# Patient Record
Sex: Female | Born: 1995 | Race: White | Hispanic: No | Marital: Single | Smoking: Current every day smoker
Health system: Southern US, Community
[De-identification: ages and names within clinical notes are randomized; demographics above are authoritative.]

## PROBLEM LIST (undated history)

## (undated) DIAGNOSIS — R011 Cardiac murmur, unspecified: Secondary | ICD-10-CM

---
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
## 2019-10-20 ENCOUNTER — Emergency Department: Payer: Medicaid Other

## 2019-10-20 ENCOUNTER — Emergency Department
Admission: EM | Admit: 2019-10-20 | Discharge: 2019-10-20 | Disposition: A | Discharge status: Home / Self Care | Payer: Medicaid Other | Attending: Emergency Medicine | Admitting: Emergency Medicine

## 2019-10-20 ENCOUNTER — Other Ambulatory Visit: Payer: Self-pay

## 2019-10-20 ENCOUNTER — Encounter: Payer: Self-pay | Admitting: Emergency Medicine

## 2019-10-20 DIAGNOSIS — Y9389 Activity, other specified: Secondary | ICD-10-CM | POA: Diagnosis not present

## 2019-10-20 DIAGNOSIS — W25XXXA Contact with sharp glass, initial encounter: Secondary | ICD-10-CM | POA: Diagnosis not present

## 2019-10-20 DIAGNOSIS — S90851A Superficial foreign body, right foot, initial encounter: Secondary | ICD-10-CM | POA: Insufficient documentation

## 2019-10-20 DIAGNOSIS — Y92018 Other place in single-family (private) house as the place of occurrence of the external cause: Secondary | ICD-10-CM | POA: Diagnosis not present

## 2019-10-20 DIAGNOSIS — Y998 Other external cause status: Secondary | ICD-10-CM | POA: Insufficient documentation

## 2019-10-20 DIAGNOSIS — Z79899 Other long term (current) drug therapy: Secondary | ICD-10-CM | POA: Insufficient documentation

## 2019-10-20 DIAGNOSIS — F17210 Nicotine dependence, cigarettes, uncomplicated: Secondary | ICD-10-CM | POA: Diagnosis not present

## 2019-10-20 DIAGNOSIS — S99921A Unspecified injury of right foot, initial encounter: Secondary | ICD-10-CM | POA: Diagnosis present

## 2019-10-20 MED ORDER — LIDOCAINE HCL (PF) 1 % IJ SOLN
INTRAMUSCULAR | Status: AC
  Administered 2019-10-20: 10:00:00 5 mL
  Filled 2019-10-20: qty 5

## 2019-10-20 MED ORDER — IBUPROFEN 600 MG PO TABS
600.0000 mg | ORAL_TABLET | Freq: Once | ORAL | Status: AC
  Administered 2019-10-20: 10:00:00 600 mg via ORAL
  Filled 2019-10-20: qty 1

## 2019-10-20 MED ORDER — LIDOCAINE HCL (PF) 1 % IJ SOLN: 5.0000 mL | Freq: Once | INTRAMUSCULAR | Status: AC

## 2019-10-20 MED ORDER — TRAMADOL HCL 50 MG PO TABS
50.0000 mg | ORAL_TABLET | Freq: Once | ORAL | Status: AC
  Administered 2019-10-20: 10:00:00 50 mg via ORAL
  Filled 2019-10-20: qty 1

## 2019-10-20 MED ORDER — LIDOCAINE-EPINEPHRINE-TETRACAINE (LET) TOPICAL GEL
3.0000 mL | Freq: Once | TOPICAL | Status: AC
  Administered 2019-10-20: 3 mL via TOPICAL

## 2019-10-20 NOTE — Discharge Instructions (Signed)
Follow discharge care instructions and may take over-the-counter ibuprofen or Tylenol as needed for pain.

## 2019-10-20 NOTE — ED Notes (Signed)
See triage note  Presents with pain to right foot  States she stepped on a christmas light about 1 week ago  Cont's to have pain with some swelling
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
## 2019-10-20 NOTE — ED Triage Notes (Signed)
Pt via pov from home with foot pain after stepping on a broken christmas light a week ago. Pt states she has a "knot" in her foot that hurts when she walks on it. Pt denies fever, swelling. NAD noted.
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
